# Patient Record
Sex: Male | Born: 1975 | Race: White | Hispanic: No | Marital: Married | Smoking: Never smoker
Health system: Southern US, Community
[De-identification: ages and names within clinical notes are randomized; demographics above are authoritative.]

## PROBLEM LIST (undated history)

## (undated) DIAGNOSIS — F329 Major depressive disorder, single episode, unspecified: Secondary | ICD-10-CM

## (undated) DIAGNOSIS — F419 Anxiety disorder, unspecified: Secondary | ICD-10-CM

## (undated) DIAGNOSIS — T7840XA Allergy, unspecified, initial encounter: Secondary | ICD-10-CM

## (undated) DIAGNOSIS — F32A Depression, unspecified: Secondary | ICD-10-CM

## (undated) HISTORY — DX: Major depressive disorder, single episode, unspecified: F32.9

## (undated) HISTORY — PX: HERNIA REPAIR: SHX51

## (undated) HISTORY — DX: Allergy, unspecified, initial encounter: T78.40XA

## (undated) HISTORY — DX: Anxiety disorder, unspecified: F41.9

## (undated) HISTORY — DX: Depression, unspecified: F32.A

---

## 2015-01-16 ENCOUNTER — Encounter (HOSPITAL_BASED_OUTPATIENT_CLINIC_OR_DEPARTMENT_OTHER): Payer: Self-pay

## 2015-01-16 ENCOUNTER — Encounter: Payer: Self-pay | Admitting: Medical

## 2015-01-16 ENCOUNTER — Ambulatory Visit (INDEPENDENT_AMBULATORY_CARE_PROVIDER_SITE_OTHER): Payer: BLUE CROSS/BLUE SHIELD | Admitting: Medical

## 2015-01-16 ENCOUNTER — Ambulatory Visit (HOSPITAL_BASED_OUTPATIENT_CLINIC_OR_DEPARTMENT_OTHER)
Admission: RE | Admit: 2015-01-16 | Discharge: 2015-01-16 | Disposition: A | Discharge status: Home / Self Care | Payer: BLUE CROSS/BLUE SHIELD | Source: Ambulatory Visit | Attending: Medical | Admitting: Medical

## 2015-01-16 ENCOUNTER — Telehealth: Payer: Self-pay | Admitting: Medical

## 2015-01-16 VITALS — BP 106/78 | HR 59 | Temp 97.6°F | Ht 74.0 in | Wt 215.5 lb

## 2015-01-16 DIAGNOSIS — R1030 Lower abdominal pain, unspecified: Secondary | ICD-10-CM | POA: Diagnosis not present

## 2015-01-16 DIAGNOSIS — R1031 Right lower quadrant pain: Secondary | ICD-10-CM | POA: Diagnosis not present

## 2015-01-16 DIAGNOSIS — R109 Unspecified abdominal pain: Secondary | ICD-10-CM | POA: Insufficient documentation

## 2015-01-16 DIAGNOSIS — K4090 Unilateral inguinal hernia, without obstruction or gangrene, not specified as recurrent: Secondary | ICD-10-CM | POA: Diagnosis not present

## 2015-01-16 LAB — CBC WITH DIFFERENTIAL/PLATELET
BASOS PCT: 0.4 % (ref 0.0–3.0)
Basophils Absolute: 0 10*3/uL (ref 0.0–0.1)
EOS PCT: 1.1 % (ref 0.0–5.0)
Eosinophils Absolute: 0.1 10*3/uL (ref 0.0–0.7)
HEMATOCRIT: 48 % (ref 39.0–52.0)
HEMOGLOBIN: 16.1 g/dL (ref 13.0–17.0)
LYMPHS PCT: 22.6 % (ref 12.0–46.0)
Lymphs Abs: 1.7 10*3/uL (ref 0.7–4.0)
MCHC: 33.5 g/dL (ref 30.0–36.0)
MCV: 91.4 fl (ref 78.0–100.0)
Monocytes Absolute: 0.5 10*3/uL (ref 0.1–1.0)
Monocytes Relative: 6.7 % (ref 3.0–12.0)
NEUTROS ABS: 5.3 10*3/uL (ref 1.4–7.7)
Neutrophils Relative %: 69.2 % (ref 43.0–77.0)
PLATELETS: 218 10*3/uL (ref 150.0–400.0)
RBC: 5.25 Mil/uL (ref 4.22–5.81)
RDW: 13.7 % (ref 11.5–15.5)
WBC: 7.7 10*3/uL (ref 4.0–10.5)

## 2015-01-16 LAB — COMPREHENSIVE METABOLIC PANEL
ALBUMIN: 4.7 g/dL (ref 3.5–5.2)
ALT: 23 U/L (ref 0–53)
AST: 20 U/L (ref 0–37)
Alkaline Phosphatase: 52 U/L (ref 39–117)
BUN: 11 mg/dL (ref 6–23)
CALCIUM: 9.7 mg/dL (ref 8.4–10.5)
CHLORIDE: 102 meq/L (ref 96–112)
CO2: 29 meq/L (ref 19–32)
CREATININE: 0.96 mg/dL (ref 0.40–1.50)
GFR: 91.97 mL/min (ref >60.00)
Glucose, Bld: 96 mg/dL (ref 70–99)
POTASSIUM: 4.2 meq/L (ref 3.5–5.1)
Sodium: 138 mEq/L (ref 135–145)
Total Bilirubin: 1 mg/dL (ref 0.2–1.2)
Total Protein: 7.4 g/dL (ref 6.0–8.3)

## 2015-01-16 MED ORDER — IOHEXOL 300 MG/ML  SOLN
100.0000 mL | Freq: Once | INTRAMUSCULAR | Status: AC | PRN
  Administered 2015-01-16: 100 mL via INTRAVENOUS

## 2015-01-16 MED ORDER — DICLOFENAC SODIUM 75 MG PO TBEC: 75.0000 mg | DELAYED_RELEASE_TABLET | Freq: Two times a day (BID) | ORAL | Status: DC

## 2015-01-16 NOTE — Patient Instructions (Addendum)
Will try to refer to surgeon to evaluate if you have early rt inguinal hernia.  Cbc stat today and cmp. Diclofenac rx today.  Since I was told stat referral to surgeon impossible I do want to do Ct abd/pelvis to evaluate our appendix.  Follow up in 7 days or as needed.  Also even if test negative but if your pain were to worsen then ED evaluation. Sometimes early initial testing negative then later turn positive.

## 2015-01-16 NOTE — Telephone Encounter (Signed)
Informed of ct findings/hernia found. Will refer to surgeon for hernia. Will you try to get him in next week.

## 2015-01-16 NOTE — Progress Notes (Signed)
Subjective:    Patient ID: Corey Howe, male    DOB: 07-Feb-1976, 39 y.o.   MRN: [id number]  HPI   I have reviewed pt PMH, PSH, FH, Social History and Surgical History  Mild seasonal allergies in the fall. Does not have to take meds.  History of both depression and anxiety in the past. Was on some med 2-3 years ago. Was on xanax and ssri in past. Did not like side effects. But good mood now.  Pt works at [employer], no exercise, 2-3 cups coffee a day, he admits to eating fried foods, unhealthy, 2 beers a day up until recently, married- wife pregant.  Pt in states his lower abdomen hurts. Pain for about 10 days. Pain is constant. Pt states 2 wks ago was lifting some furniture(then next day or so noted some discomfort). He has a lot of gas and pressure. Pt has appetite. He not sure if eating makes it worse. No fever, no chills, no nausea, and no vomiting. Pt had bowel movement this am.  Pain will come and go.     Review of Systems  Constitutional: Negative for fever, chills and fatigue.  HENT: Negative for congestion, drooling and ear pain.   Respiratory: Negative for cough, chest tightness, shortness of breath and wheezing.   Cardiovascular: Negative for chest pain and palpitations.  Gastrointestinal: Positive for abdominal pain.  Genitourinary: Negative for dysuria, frequency and penile pain.  Musculoskeletal: Negative for back pain.  Neurological: Negative for dizziness and headaches.  Hematological: Negative for adenopathy. Does not bruise/bleed easily.    Past Medical History  Diagnosis Date  . Allergy   . Anxiety   . Depression     Social History   Social History  . Marital Status: Married    Spouse Name: N/A  . Number of Children: N/A  . Years of Education: N/A   Occupational History  . Not on file.   Social History Main Topics  . Smoking status: Never Smoker   . Smokeless tobacco: Never Used  . Alcohol Use: 1.2 oz/week    0 Standard  drinks or equivalent, 2 Cans of beer per week     Comment: 2 beers a day but reduced recently.  . Drug Use: No  . Sexual Activity: Not on file   Other Topics Concern  . Not on file   Social History Narrative  . No narrative on file    History reviewed. No pertinent past surgical history.  Family History  Problem Relation Age of Onset  . Gallbladder disease Mother     No Known Allergies  No current outpatient prescriptions on file prior to visit.   No current facility-administered medications on file prior to visit.    BP 106/78 mmHg  Pulse 59  Temp(Src) 97.6 F (36.4 C) (Oral)  Ht  (1.88 m)  Wt 215 lb 8 oz (97.75 kg)  BMI 27.66 kg/m2  SpO2 98%       Objective:   Physical Exam General Appearance- Not in acute distress.  HEENT Eyes- Scleraeral/Conjuntiva-bilat- Not Yellow. Mouth & Throat- Normal.  Chest and Lung Exam Auscultation: Breath sounds:-Normal. Adventitious sounds:- No Adventitious sounds.  Cardiovascular Auscultation:Rythm - Regular. Heart Sounds -Normal heart sounds.  Abdomen Inspection:-Inspection Normal.  Palpation/Perucssion: Palpation and Percussion of the abdomen reveal- very faint rlq  Tender, No Rebound tenderness, No rigidity(Guarding) and No Palpable abdominal masses.  Liver:-Normal.  Spleen:- Normal.   No heal jar pain.  Genital- normal penis. Descended testicles.  Rt inguinal canal at upper portion on coughin he does have what appears to be hernia the bulges quickly and reduces quickly. Pain localizes directly/mostly in this area. Lt inguinal canal- clear.  Back- no cva tenderness      Assessment & Plan:  Will try to refer to surgeon to evaluate if you have early rt inguinal hernia.  Cbc stat today and cmp. Diclofenac rx today.  Since I was told stat referral to surgeon impossible I do want to do Ct abd/pelvis to evaluate our appendix.  Follow up in 7 days or as needed.  Also even if test negative but if your pain  were to worsen then ED evaluation. Sometimes early initial testing negative then later turn

## 2015-01-17 NOTE — Telephone Encounter (Signed)
Referral faxed to [facility]/awaiting appt

## 2015-01-28 ENCOUNTER — Emergency Department (HOSPITAL_BASED_OUTPATIENT_CLINIC_OR_DEPARTMENT_OTHER)
Admission: EM | Admit: 2015-01-28 | Discharge: 2015-01-29 | Disposition: A | Discharge status: Home / Self Care | Payer: BLUE CROSS/BLUE SHIELD | Attending: Emergency Medicine | Admitting: Emergency Medicine

## 2015-01-28 ENCOUNTER — Encounter (HOSPITAL_BASED_OUTPATIENT_CLINIC_OR_DEPARTMENT_OTHER): Payer: Self-pay | Admitting: Emergency Medicine

## 2015-01-28 DIAGNOSIS — Y92003 Bedroom of unspecified non-institutional (private) residence as the place of occurrence of the external cause: Secondary | ICD-10-CM | POA: Diagnosis not present

## 2015-01-28 DIAGNOSIS — Z23 Encounter for immunization: Secondary | ICD-10-CM | POA: Insufficient documentation

## 2015-01-28 DIAGNOSIS — Z791 Long term (current) use of non-steroidal anti-inflammatories (NSAID): Secondary | ICD-10-CM | POA: Insufficient documentation

## 2015-01-28 DIAGNOSIS — Y998 Other external cause status: Secondary | ICD-10-CM | POA: Diagnosis not present

## 2015-01-28 DIAGNOSIS — S01311A Laceration without foreign body of right ear, initial encounter: Secondary | ICD-10-CM | POA: Diagnosis not present

## 2015-01-28 DIAGNOSIS — Z8659 Personal history of other mental and behavioral disorders: Secondary | ICD-10-CM | POA: Diagnosis not present

## 2015-01-28 DIAGNOSIS — Y9389 Activity, other specified: Secondary | ICD-10-CM | POA: Diagnosis not present

## 2015-01-28 DIAGNOSIS — W228XXA Striking against or struck by other objects, initial encounter: Secondary | ICD-10-CM | POA: Insufficient documentation

## 2015-01-28 MED ORDER — TETANUS-DIPHTH-ACELL PERTUSSIS 5-2.5-18.5 LF-MCG/0.5 IM SUSP
0.5000 mL | Freq: Once | INTRAMUSCULAR | Status: AC
  Administered 2015-01-29: 0.5 mL via INTRAMUSCULAR
  Filled 2015-01-28: qty 0.5

## 2015-01-28 MED ORDER — LIDOCAINE HCL (PF) 1 % IJ SOLN
5.0000 mL | Freq: Once | INTRAMUSCULAR | Status: AC
  Administered 2015-01-29: 5 mL via INTRADERMAL
  Filled 2015-01-28: qty 5

## 2015-01-28 MED ORDER — CEFAZOLIN SODIUM 1-5 GM-% IV SOLN
1.0000 g | Freq: Once | INTRAVENOUS | Status: AC
  Administered 2015-01-29: 1 g via INTRAVENOUS
  Filled 2015-01-28: qty 50

## 2015-01-28 NOTE — ED Notes (Signed)
Pt in with severe lac to R ear, after falling and hitting it on a bedside table. Pt was in hot tub and started feeling dizzy and went to get in the bed and fell. States same has happened before.

## 2015-01-28 NOTE — ED Provider Notes (Signed)
CSN: [id number]     Arrival date & time 01/28/15  2218 History  By signing my name below, I, Corey Howe, attest that this documentation has been prepared under the direction and in the presence of Paula Libra, MD. Electronically Signed: Bethel Howe, ED Scribe. 01/28/2015. 11:42 PM   Chief Complaint  Patient presents with  . Laceration   The history is provided by the patient. No language interpreter was used.   Corey Howe is a 39 y.o. male who presents to the Emergency Department complaining of a fall this evening at home. The pt was lightheaded after standing from a hot bath. He walked to his bedroom before falling and striking his head on a bedside table. There was no loss of consciousness. He fell against a nightstand lacerating his right ear. There is a full-thickness laceration to the right ear. There was copious bleeding at home which has improved with pressure. There is mild to moderate pain associated with the injury, worse with palpation. Pt denies neck pain. It is not uncommon for him to get lightheaded after a hot bath or shower.  Past Medical History  Diagnosis Date  . Allergy   . Anxiety   . Depression    History reviewed. No pertinent past surgical history. Family History  Problem Relation Age of Onset  . Gallbladder disease Mother    Social History  Substance Use Topics  . Smoking status: Never Smoker   . Smokeless tobacco: Never Used  . Alcohol Use: 1.2 oz/week    0 Standard drinks or equivalent, 2 Cans of beer per week     Comment: 2 beers a day but reduced recently.    Review of Systems 10 Systems reviewed and all are negative for acute change except as noted in the HPI.  Allergies  Review of patient's allergies indicates no known allergies.  Home Medications   Prior to Admission medications   Medication Sig Start Date End Date Taking? Authorizing Provider  diclofenac (VOLTAREN) 75 MG EC tablet Take 1 tablet (75 mg total) by mouth 2 (two) times  daily. 01/16/15   Edward Saguier, PA-C   BP 126/84 mmHg  Pulse 78  Temp(Src) 98.6 F (37 C) (Oral)  Resp 18  Ht  (1.854 m)  Wt 210 lb (95.255 kg)  BMI 27.71 kg/m2  SpO2 100%  Physical Exam General: Well-developed, well-nourished male in no acute distress; appearance consistent with age of record HENT: normocephalic; full thickness laceration of the right external ear:   Eyes: pupils equal, round and reactive to light; extraocular muscles intact Neck: supple; no C-spine tenderness Heart: regular rate and rhythm; no murmurs, rubs or gallops Lungs: clear to auscultation bilaterally Abdomen: soft; nondistended; nontender; bowel sounds present Extremities: No deformity; full range of motion; pulses normal Neurologic: Awake, alert and oriented; motor function intact in all extremities and symmetric; no facial droop Skin: Warm and dry Psychiatric: Flat affect  ED Course  Procedures (including critical care time) DIAGNOSTIC STUDIES: Oxygen Saturation is 100% on RA,  normal by my interpretation.    COORDINATION OF CARE: 11:38 PM Discussed treatment plan which includes abx and maxillofacial surgery consult with pt at bedside and pt agreed to plan.  LACERATION REPAIR Performed by: Hanley Seamen Authorized by: Hanley Seamen Consent: Verbal consent obtained. Risks and benefits: risks, benefits and alternatives were discussed Consent given by: patient Patient identity confirmed: provided demographic data Prepped and Draped in normal sterile fashion Wound explored  Laceration Location: Anterior right ear  Laceration  Length: 4 cm  No Foreign Bodies seen or palpated  Anesthesia: local infiltration  Local anesthetic: lidocaine 2 % without epinephrine  Anesthetic total: 5 ml  Irrigation method: syringe Amount of cleaning: standard  Skin closure: 5-0 Prolene   Number of sutures: 7   Technique: Simple interrupted   Patient tolerance: Patient tolerated the procedure  well with no immediate complications.   LACERATION REPAIR Performed by: [name],[name] Authorized by: [name],[name] Consent: Verbal consent obtained. Risks and benefits: risks, benefits and alternatives were discussed Consent given by: patient Patient identity confirmed: provided demographic data Prepped and Draped in normal sterile fashion Wound explored  Laceration Location: Posterior right ear  Laceration Length: 5 cm  No Foreign Bodies seen or palpated  Anesthesia: local infiltration  Local anesthetic: lidocaine 2 % without epinephrine  Anesthetic total: 4 ml  Irrigation method: syringe Amount of cleaning: standard  Skin closure: 4-0 Prolene   Number of sutures: 5   Technique: Simple interrupted   Patient tolerance: Patient tolerated the procedure well with no immediate complications.      MDM  Dr. Jeanice Limurham will see the patient later this morning in his office. Given Ancef 1 gram in the ED and his tetanus was updated.  Final diagnoses:  Laceration of right ear, initial encounter   I personally performed the services described in this documentation, which was scribed in my presence. The recorded information has been reviewed and is accurate.    [name], MD 01/29/15 0040

## 2015-01-28 NOTE — ED Notes (Signed)
Fell after getting out of hot tube hit rt ear on dresser  Lac to rt ear

## 2015-01-28 NOTE — ED Notes (Signed)
Took 2 500mg  tylenol prior to arrival.

## 2015-01-28 NOTE — ED Notes (Signed)
Pt taking a shower got dizzy and fell hitting right ear on corner of dresser. Ear is severed with bleeding controlled.

## 2015-01-29 ENCOUNTER — Encounter (HOSPITAL_BASED_OUTPATIENT_CLINIC_OR_DEPARTMENT_OTHER): Payer: Self-pay | Admitting: Emergency Medicine

## 2015-01-29 MED ORDER — HYDROCODONE-ACETAMINOPHEN 5-325 MG PO TABS: 1.0000 | ORAL_TABLET | Freq: Four times a day (QID) | ORAL | Status: DC | PRN

## 2015-01-29 MED ORDER — LIDOCAINE HCL (PF) 1 % IJ SOLN
INTRAMUSCULAR | Status: AC
  Filled 2015-01-29: qty 5

## 2015-01-29 MED ORDER — LIDOCAINE HCL (PF) 1 % IJ SOLN
5.0000 mL | Freq: Once | INTRAMUSCULAR | Status: AC
  Administered 2015-01-29: 5 mL via INTRADERMAL

## 2015-01-29 MED ORDER — CEPHALEXIN 500 MG PO CAPS: 500.0000 mg | ORAL_CAPSULE | Freq: Four times a day (QID) | ORAL | Status: DC

## 2015-01-29 NOTE — Discharge Instructions (Signed)
Facial Laceration ° A facial laceration is a cut on the face. These injuries can be painful and cause bleeding. Lacerations usually heal quickly, but they need special care to reduce scarring. °DIAGNOSIS  °Your health care provider will take a medical history, ask for details about how the injury occurred, and examine the wound to determine how deep the cut is. °TREATMENT  °Some facial lacerations may not require closure. Others may not be able to be closed because of an increased risk of infection. The risk of infection and the chance for successful closure will depend on various factors, including the amount of time since the injury occurred. °The wound may be cleaned to help prevent infection. If closure is appropriate, pain medicines may be given if needed. Your health care provider will use stitches (sutures), wound glue (adhesive), or skin adhesive strips to repair the laceration. These tools bring the skin edges together to allow for faster healing and a better cosmetic outcome. If needed, you may also be given a tetanus shot. °HOME CARE INSTRUCTIONS °· Only take over-the-counter or prescription medicines as directed by your health care provider. °· Follow your health care provider's instructions for wound care. These instructions will vary depending on the technique used for closing the wound. °For Sutures: °· Keep the wound clean and dry.   °· If you were given a bandage (dressing), you should change it at least once a day. Also change the dressing if it becomes wet or dirty, or as directed by your health care provider.   °· Wash the wound with soap and water 2 times a day. Rinse the wound off with water to remove all soap. Pat the wound dry with a clean towel.   °· After cleaning, apply a thin layer of the antibiotic ointment recommended by your health care provider. This will help prevent infection and keep the dressing from sticking.   °· You may shower as usual after the first 24 hours. Do not soak the  wound in water until the sutures are removed.   °· Get your sutures removed as directed by your health care provider. With facial lacerations, sutures should usually be taken out after 4-5 days to avoid stitch marks.   °· Wait a few days after your sutures are removed before applying any makeup. °For Skin Adhesive Strips: °· Keep the wound clean and dry.   °· Do not get the skin adhesive strips wet. You may bathe carefully, using caution to keep the wound dry.   °· If the wound gets wet, pat it dry with a clean towel.   °· Skin adhesive strips will fall off on their own. You may trim the strips as the wound heals. Do not remove skin adhesive strips that are still stuck to the wound. They will fall off in time.   °For Wound Adhesive: °· You may briefly wet your wound in the shower or bath. Do not soak or scrub the wound. Do not swim. Avoid periods of heavy sweating until the skin adhesive has fallen off on its own. After showering or bathing, gently pat the wound dry with a clean towel.   °· Do not apply liquid medicine, cream medicine, ointment medicine, or makeup to your wound while the skin adhesive is in place. This may loosen the film before your wound is healed.   °· If a dressing is placed over the wound, be careful not to apply tape directly over the skin adhesive. This may cause the adhesive to be pulled off before the wound is healed.   °· Avoid   prolonged exposure to sunlight or tanning lamps while the skin adhesive is in place. °· The skin adhesive will usually remain in place for 5-10 days, then naturally fall off the skin. Do not pick at the adhesive film.   °After Healing: °Once the wound has healed, cover the wound with sunscreen during the day for 1 full year. This can help minimize scarring. Exposure to ultraviolet light in the first year will darken the scar. It can take 1-2 years for the scar to lose its redness and to heal completely.  °SEEK MEDICAL CARE IF: °· You have a fever. °SEEK IMMEDIATE  MEDICAL CARE IF: °· You have redness, pain, or swelling around the wound.   °· You see a yellowish-white fluid (pus) coming from the wound.   °  °This information is not intended to replace advice given to you by your health care provider. Make sure you discuss any questions you have with your health care provider. °  °Document Released: 03/04/2004 Document Revised: 02/15/2014 Document Reviewed: 09/07/2012 °Elsevier Interactive Patient Education ©2016 [employer] ° °

## 2015-01-30 ENCOUNTER — Telehealth: Payer: Self-pay | Admitting: Medical

## 2015-01-30 NOTE — Telephone Encounter (Signed)
Patient Name: Corey Howe DOB: 07/06/1975 Initial Comment caller states husband cut his ear and has stitches, has wound care questions Nurse Assessment Nurse: Yetta BarreJones, RN, Miranda Date/Time (Eastern Time): 01/30/2015 11:15:58 AM Confirm and document reason for call. If symptomatic, describe symptoms. ---Caller states her husband has sutures in his ear from an injury on Tuesday. The site looks good but she has questions about wound care. It was recommended he follow up with plastic surgery but has not because they cannot afford it. Has the patient traveled out of the country within the last 30 days? ---Not Applicable Does the patient have any new or worsening symptoms? ---Yes Will a triage be completed? ---Yes Related visit to physician within the last 2 weeks? ---Yes Does the PT have any chronic conditions? (i.e. diabetes, asthma, etc.) ---Unknown Is this a behavioral health or substance abuse call? ---No Guidelines Guideline Title Affirmed Question Affirmed Notes Suture or Staple Questions Care of sutured wound, questions about Final Disposition User Home Care Yetta BarreJones, RN, Miranda Disagree/Comply: Danella Maiersomply

## 2015-02-05 ENCOUNTER — Encounter: Payer: Self-pay | Admitting: Medical

## 2015-02-05 ENCOUNTER — Ambulatory Visit (INDEPENDENT_AMBULATORY_CARE_PROVIDER_SITE_OTHER): Payer: BLUE CROSS/BLUE SHIELD | Admitting: Medical

## 2015-02-05 VITALS — BP 118/78 | HR 58 | Temp 98.0°F | Ht 74.0 in | Wt 212.0 lb

## 2015-02-05 DIAGNOSIS — Z4802 Encounter for removal of sutures: Secondary | ICD-10-CM

## 2015-02-05 DIAGNOSIS — R55 Syncope and collapse: Secondary | ICD-10-CM | POA: Diagnosis not present

## 2015-02-05 NOTE — Progress Notes (Signed)
Subjective:    Patient ID: Corey Howe, male    DOB: 12/03/1975, 39 y.o.   MRN: [id number]  HPI    Pt in for follow the ED. He had sutures placed. He had just been in a very hot tub night of injury. Also had been drinking rum and coke(he stood up and walked passed out and hit night stand. Suffered laceration to his ear.  He state 2 other events in past 6 months passing out episodes coming out of shower .Another time he was constipated and straining. Then passed out.  Pt rt ear feeling itching presently. Comes and goes. They told to remove   Pt states his prior abdomen appears to have been related to constipation. He states since eating high fiber diet every day has resolved his symptoms.    Review of Systems  Constitutional: Negative for fever, chills and fatigue.  Respiratory: Negative for cough, choking, chest tightness and wheezing.   Cardiovascular: Negative for chest pain and palpitations.  Gastrointestinal: Negative for abdominal pain.  Musculoskeletal: Negative for back pain.  Neurological: Negative for dizziness, seizures, weakness, numbness and headaches.       None presenlty.  Hematological: Negative for adenopathy. Does not bruise/bleed easily.  Psychiatric/Behavioral: Negative for behavioral problems.    Past Medical History  Diagnosis Date  . Allergy   . Anxiety   . Depression     Social History   Social History  . Marital Status: Married    Spouse Name: N/A  . Number of Children: N/A  . Years of Education: N/A   Occupational History  . Not on file.   Social History Main Topics  . Smoking status: Never Smoker   . Smokeless tobacco: Never Used  . Alcohol Use: 1.2 oz/week    2 Cans of beer, 0 Standard drinks or equivalent per week     Comment: 2 beers a day but reduced recently.  . Drug Use: No  . Sexual Activity: Not on file   Other Topics Concern  . Not on file   Social History Narrative    No past surgical history on file.  Family History    Problem Relation Age of Onset  . Gallbladder disease Mother     No Known Allergies  Current Outpatient Prescriptions on File Prior to Visit  Medication Sig Dispense Refill  . cephALEXin (KEFLEX) 500 MG capsule Take 1 capsule (500 mg total) by mouth 4 (four) times daily. 20 capsule 0  . diclofenac (VOLTAREN) 75 MG EC tablet Take 1 tablet (75 mg total) by mouth 2 (two) times daily. 30 tablet 0  . HYDROcodone-acetaminophen (NORCO) 5-325 MG tablet Take 1-2 tablets by mouth every 6 (six) hours as needed (for pain). 10 tablet 0   No current facility-administered medications on file prior to visit.    BP 118/78 mmHg  Pulse 58  Temp(Src) 98 F (36.7 C) (Oral)  Ht 6\' 2"  (1.88 m)  Wt 212 lb (96.163 kg)  BMI 27.21 kg/m2  SpO2 98%       Objective:   Physical Exam  General Mental Status- Alert. General Appearance- Not in acute distress.   Skin General: Color- Normal Color. Moisture- Normal Moisture.  Neck Carotid Arteries- Normal color. Moisture- Normal Moisture. No carotid bruits. No JVD.  Chest and Lung Exam Auscultation: Breath Sounds:-Normal.  Cardiovascular Auscultation:Rythm- Regular. Murmurs & Other Heart Sounds:Auscultation of the heart reveals- No Murmurs.  Abdomen Inspection:-Inspeection Normal. Palpation/Percussion:Note:No mass. Palpation and Percussion of the abdomen reveal- Non Tender,  Non Distended + BS, no rebound or guarding.    Neurologic Cranial Nerve exam:- CN III-XII intact(No nystagmus), symmetric smile. Strength:- 5/5 equal and symmetric strength both upper and lower extremities.  Rt ear- 12 sutures placed. For full description see pictures from ED visit. The ear looks moderate well healed(took out 8 sutures that look well healed where  parts of suture knots buried.(thought best to remove those) 4 sutures remaining and will remove on Friday.     Assessment & Plan:  I removed 8 sutures today. The remaining 4 sutures want to remove on  Friday.  With you repetitive syncope episodes and your upcoming surgery which will require general anesthesia, we did ekg today and I do want to get surgeon opinion regarding your passing out episodes. If they think only vasovagal type.

## 2015-02-05 NOTE — Progress Notes (Signed)
Pre visit review using our clinic review tool, if applicable. No additional management support is needed unless otherwise documented below in the visit note. 

## 2015-02-05 NOTE — Patient Instructions (Signed)
I removed 8 sutures today. The remaining 4 sutures want to remove on Friday.  With you repetitive syncope episodes and your upcoming surgery which will require general anesthesia, we did ekg today and I do want to get surgeon opinion regarding your passing out episodes. If they think only vasovagal type.

## 2015-02-07 ENCOUNTER — Encounter: Payer: Self-pay | Admitting: Medical

## 2015-02-07 ENCOUNTER — Ambulatory Visit (INDEPENDENT_AMBULATORY_CARE_PROVIDER_SITE_OTHER): Payer: BLUE CROSS/BLUE SHIELD | Admitting: Medical

## 2015-02-07 VITALS — BP 110/70 | HR 73 | Temp 98.1°F | Ht 74.0 in | Wt 212.8 lb

## 2015-02-07 DIAGNOSIS — Z4802 Encounter for removal of sutures: Secondary | ICD-10-CM

## 2015-02-07 NOTE — Patient Instructions (Signed)
Removed sutures with no complications. Recommend pat wash and dry area for next week. No aggressive scrubbing. If any complications pain etc. Let us know.  Follow up as needed.

## 2015-02-07 NOTE — Progress Notes (Signed)
Pre visit review using our clinic review tool, if applicable. No additional management support is needed unless otherwise documented below in the visit note. 

## 2015-02-07 NOTE — Progress Notes (Signed)
   Subjective:    Patient ID: Corey CatenaCollin No, male    DOB: 12/15/1975, 39 y.o.   MRN: [id number]  HPI   Here to remove the remaining sutures from rt ear. Prior visit removed other sutures. See last note.   Review of Systems  Constitutional: Negative for fever, chills and fatigue.  HENT: Negative for ear pain.        Ear feels good. No pain       Objective:   Physical Exam  Rt ear- no redness, no tenderness, no swelling. Edges of laceration look like healing well.(Removed remaining sutures with no problem)     Assessment & Plan:  Removed sutures with no complications. Recommend pat wash and dry area for next week. No aggressive scrubbing. If any complications pain etc. Let us know.  Follow up as needed.

## 2015-02-27 ENCOUNTER — Telehealth: Payer: Self-pay | Admitting: Medical

## 2015-02-27 NOTE — Telephone Encounter (Signed)
Pt needing work note to be out from 01/16/15 when he saw [place] for dx of hernia until surgery which was done on 02/14/15. Surgeon will write him out after surgery. Discussed with Francesco Runner. She will try to get done while pt wife is in office with newborn baby. Otherwise please call him to come pick up.

## 2015-02-27 NOTE — Telephone Encounter (Signed)
Work note printed and sent with patients wife per VO from patient for wife to pickup.

## 2016-05-04 ENCOUNTER — Ambulatory Visit: Payer: BLUE CROSS/BLUE SHIELD | Admitting: Medical

## 2016-05-04 ENCOUNTER — Telehealth: Payer: Self-pay | Admitting: Medical

## 2016-05-04 NOTE — Telephone Encounter (Signed)
Patient cancelled 11:30am appointment for today due to personal reasons, charge or no charge

## 2016-05-04 NOTE — Telephone Encounter (Signed)
charge 

## 2016-05-11 ENCOUNTER — Encounter: Payer: Self-pay | Admitting: Medical

## 2017-03-25 IMAGING — CT CT ABD-PELV W/ CM
2 of 5 series · 17 of 46 positions shown, 19 images · IV contrast (APPLIED)
Comparison: None available

CLINICAL DATA: RIGHT lower quadrant pain for 10 days after moving
heavy furniture.

EXAM:
CT ABDOMEN AND PELVIS WITH CONTRAST
TECHNIQUE: Multidetector CT imaging of the abdomen and pelvis was performed
using the standard protocol following bolus administration of
intravenous contrast.
CONTRAST:  100mL OMNIPAQUE IOHEXOL 300 MG/ML  SOLN

[Series 2: abd/pelvis 5.0 b31f · axial · 0.74mm/px · z∈[-588,-163]mm · 14 of 97 slices shown, 16 images]
[im 6/97  soft-tissue]
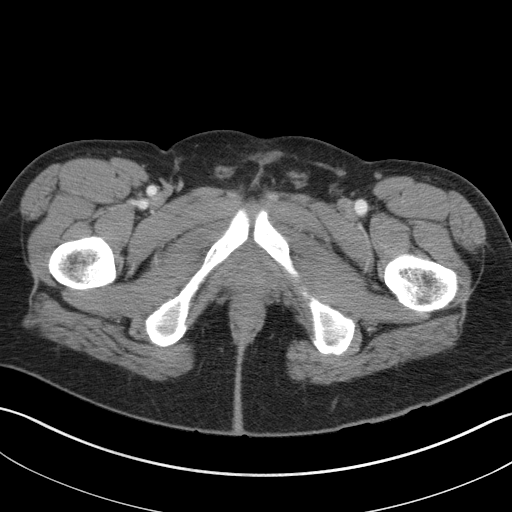
[im 6/97  bone]
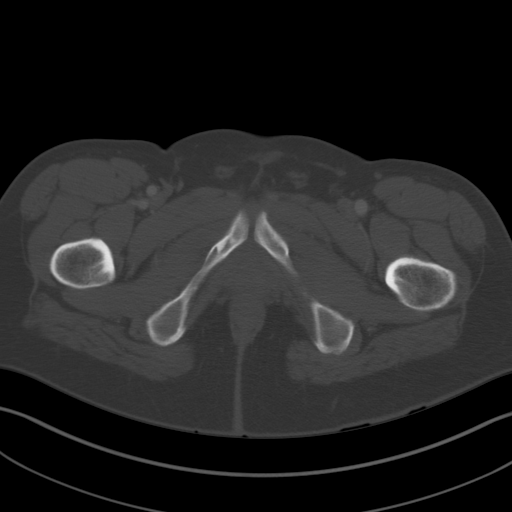
[im 11/97  soft-tissue]
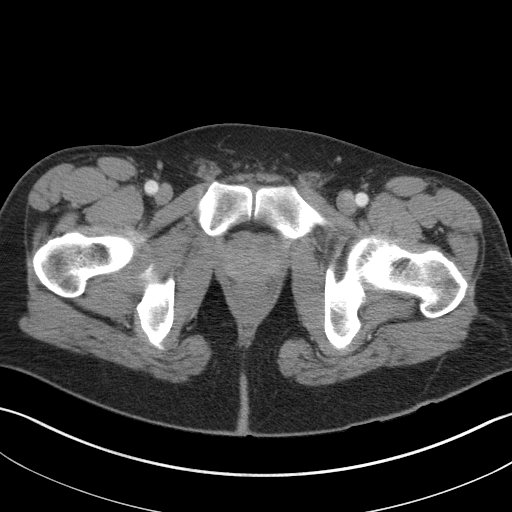
[im 22/97  soft-tissue]
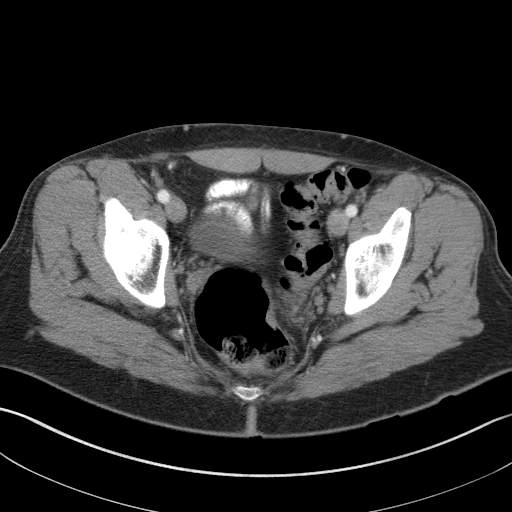
[im 27/97  soft-tissue]
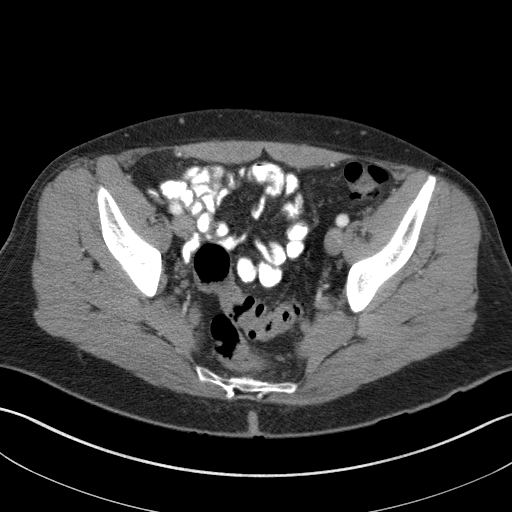
[im 33/97  soft-tissue]
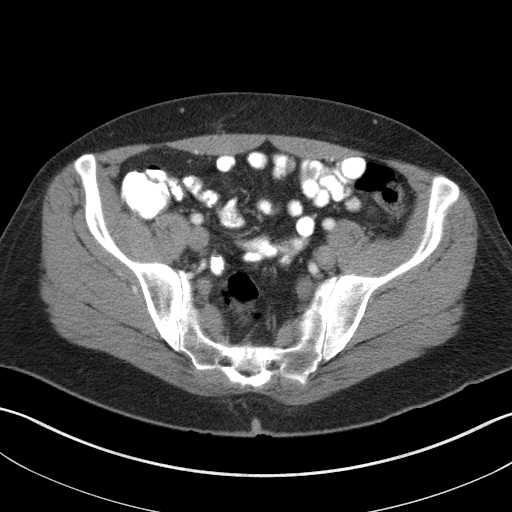
[im 38/97  soft-tissue]
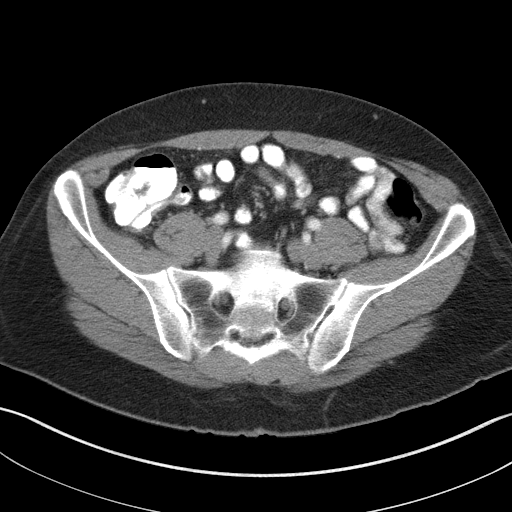
[im 43/97  soft-tissue]
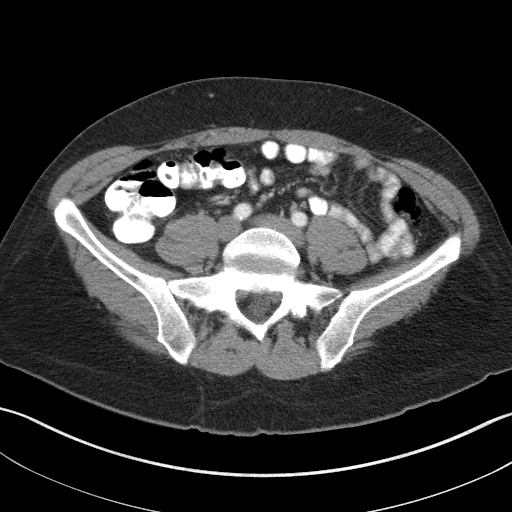
[im 54/97  soft-tissue]
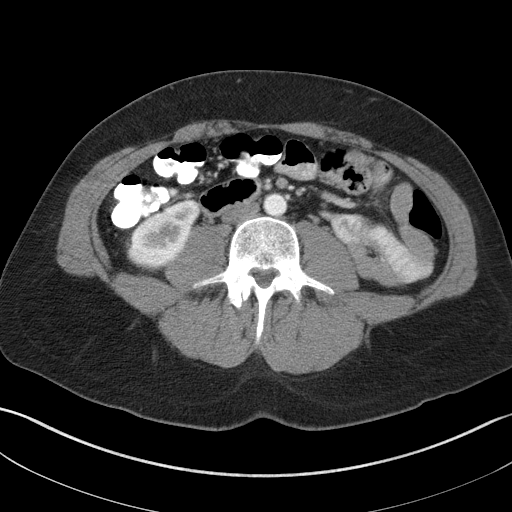
[im 59/97  soft-tissue]
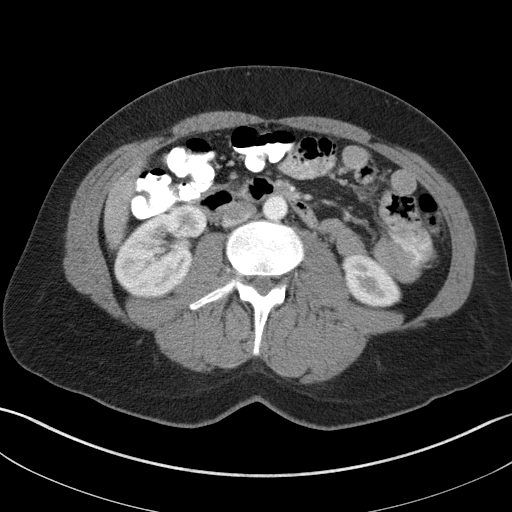
[im 59/97  bone]
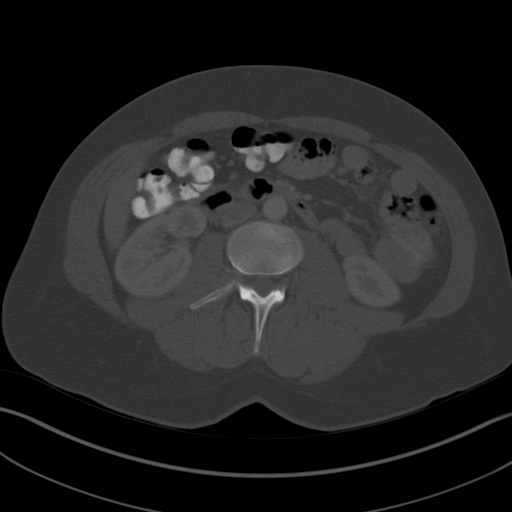
[im 65/97  soft-tissue]
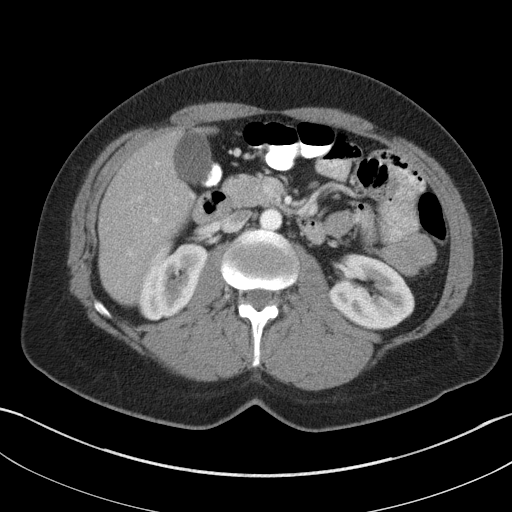
[im 70/97  soft-tissue]
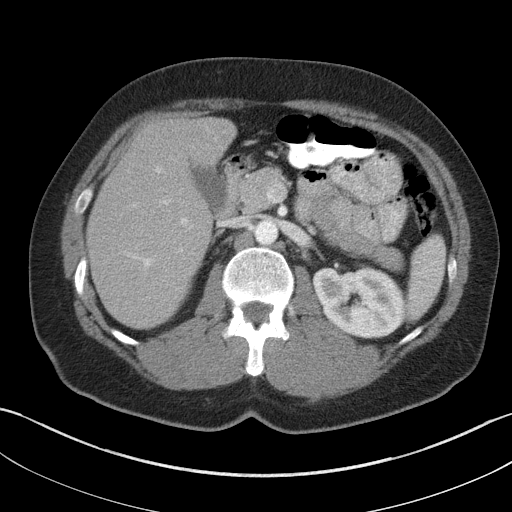
[im 75/97  soft-tissue]
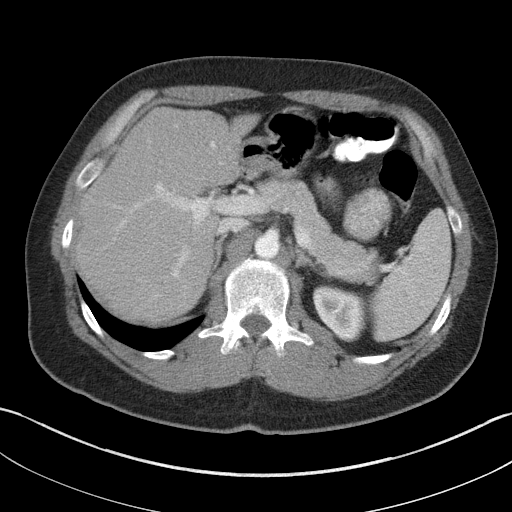
[im 86/97  soft-tissue]
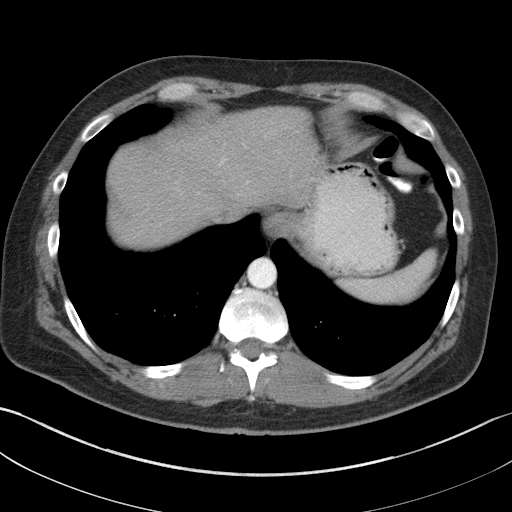
[im 91/97  soft-tissue]
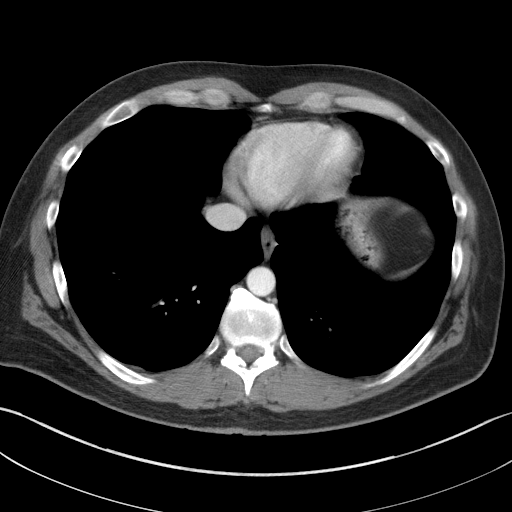

[Series 5: abd/pelvis 3.0 coronal · coronal · 0.81mm/px · 3 of 91 slices shown]
[im 31/91  soft-tissue]
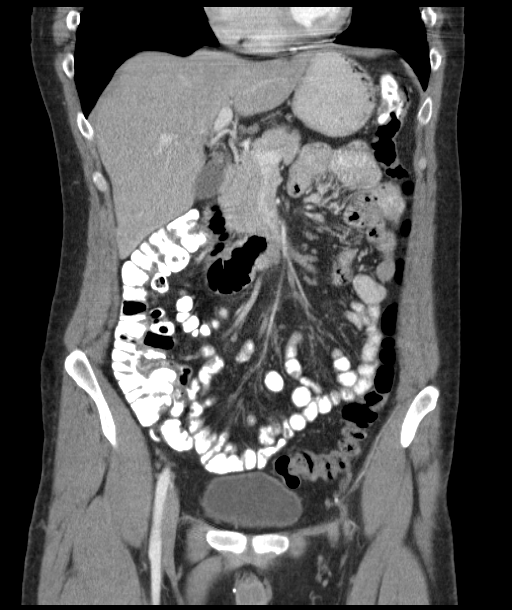
[im 41/91  soft-tissue]
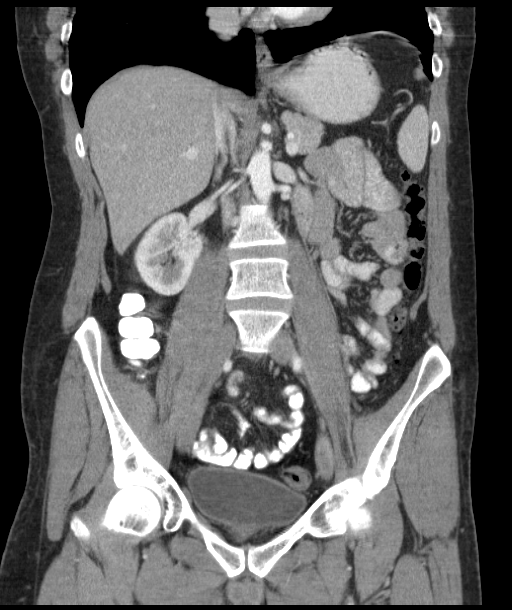
[im 51/91  soft-tissue]
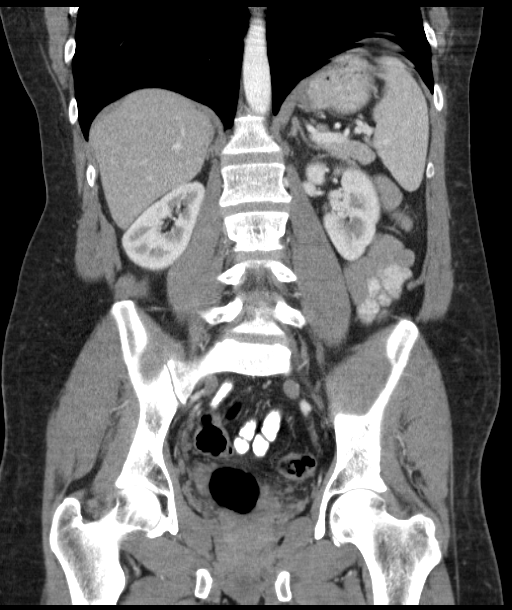

[17 of 46 positions shown; findings below may reference images not displayed]

FINDINGS: Lower chest: Lung bases are clear.

Hepatobiliary: No focal hepatic lesion. No biliary duct dilatation.
Gallbladder is normal. Common bile duct is normal.

Pancreas: Pancreas is normal. No ductal dilatation. No pancreatic
inflammation.

Spleen: Normal spleen

Adrenals/urinary tract: Adrenal glands and kidneys are normal. The
ureters and bladder normal.

Stomach/Bowel: Stomach, small bowel, appendix, and cecum are normal.
The colon and rectosigmoid colon are normal.

Vascular/Lymphatic: Abdominal aorta is normal caliber. There is no
retroperitoneal or periportal lymphadenopathy. No pelvic
lymphadenopathy.

Other: Small fat filled RIGHT inguinal hernia.

Musculoskeletal: No aggressive osseous lesion.
IMPRESSION: 1. Normal appendix.
2. No acute findings in the abdomen or pelvis.
3. Small RIGHT inguinal fat filled hernia.

## 2018-03-16 ENCOUNTER — Ambulatory Visit: Payer: 59 | Admitting: Family Medicine

## 2018-03-16 ENCOUNTER — Encounter: Payer: Self-pay | Admitting: Family Medicine

## 2018-03-16 ENCOUNTER — Encounter: Payer: Self-pay | Admitting: Emergency Medicine

## 2018-03-16 VITALS — BP 122/78 | HR 85 | Temp 98.3°F | Resp 16 | Ht 74.0 in | Wt 234.0 lb

## 2018-03-16 DIAGNOSIS — J0140 Acute pansinusitis, unspecified: Secondary | ICD-10-CM

## 2018-03-16 MED ORDER — AMOXICILLIN-POT CLAVULANATE 875-125 MG PO TABS: 1.0000 | ORAL_TABLET | Freq: Two times a day (BID) | ORAL | 0 refills | Status: DC

## 2018-03-16 NOTE — Patient Instructions (Signed)
It was a pleasure to meet you today.  Please drink plenty of water so that your urine is pale yellow or clear.   Mucinex can be used for symptoms.  Delsym can be used for cough.  The antibiotic has been sent to your pharmacy today.   Also, get plenty of rest, use tylenol or ibuprofen as needed for discomfort and follow up if symptoms do not improve in 3 to 4 days, worsen, or you develop a fever >101.   Sinusitis, Adult Sinusitis is soreness and swelling (inflammation) of your sinuses. Sinuses are hollow spaces in the bones around your face. They are located:  Around your eyes.  In the middle of your forehead.  Behind your nose.  In your cheekbones. Your sinuses and nasal passages are lined with a fluid called mucus. Mucus drains out of your sinuses. Swelling can trap mucus in your sinuses. This lets germs (bacteria, virus, or fungus) grow, which leads to infection. Most of the time, this condition is caused by a virus. What are the causes? This condition is caused by:  Allergies.  Asthma.  Germs.  Things that block your nose or sinuses.  Growths in the nose (nasal polyps).  Chemicals or irritants in the air.  Fungus (rare). What increases the risk? You are more likely to develop this condition if:  You have a weak body defense system (immune system).  You do a lot of swimming or diving.  You use nasal sprays too much.  You smoke. What are the signs or symptoms? The main symptoms of this condition are pain and a feeling of pressure around the sinuses. Other symptoms include:  Stuffy nose (congestion).  Runny nose (drainage).  Swelling and warmth in the sinuses.  Headache.  Toothache.  A cough that may get worse at night.  Mucus that collects in the throat or the back of the nose (postnasal drip).  Being unable to smell and taste.  Being very tired (fatigue).  A fever.  Sore throat.  Bad breath. How is this diagnosed? This condition is  diagnosed based on:  Your symptoms.  Your medical history.  A physical exam.  Tests to find out if your condition is short-term (acute) or long-term (chronic). Your doctor may: ? Check your nose for growths (polyps). ? Check your sinuses using a tool that has a light (endoscope). ? Check for allergies or germs. ? Do imaging tests, such as an MRI or CT scan. How is this treated? Treatment for this condition depends on the cause and whether it is short-term or long-term.  If caused by a virus, your symptoms should go away on their own within 10 days. You may be given medicines to relieve symptoms. They include: ? Medicines that shrink swollen tissue in the nose. ? Medicines that treat allergies (antihistamines). ? A spray that treats swelling of the nostrils. ? Rinses that help get rid of thick mucus in your nose (nasal saline washes).  If caused by bacteria, your doctor may wait to see if you will get better without treatment. You may be given antibiotic medicine if you have: ? A very bad infection. ? A weak body defense system.  If caused by growths in the nose, you may need to have surgery. Follow these instructions at home: Medicines  Take, use, or apply over-the-counter and prescription medicines only as told by your doctor. These may include nasal sprays.  If you were prescribed an antibiotic medicine, take it as told by your doctor.  Do not stop taking the antibiotic even if you start to feel better. Hydrate and humidify   Drink enough water to keep your pee (urine) pale yellow.  Use a cool mist humidifier to keep the humidity level in your home above 50%.  Breathe in steam for 10-15 minutes, 3-4 times a day, or as told by your doctor. You can do this in the bathroom while a hot shower is running.  Try not to spend time in cool or dry air. Rest  Rest as much as you can.  Sleep with your head raised (elevated).  Make sure you get enough sleep each night. General  instructions   Put a warm, moist washcloth on your face 3-4 times a day, or as often as told by your doctor. This will help with discomfort.  Wash your hands often with soap and water. If there is no soap and water, use hand sanitizer.  Do not smoke. Avoid being around people who are smoking (secondhand smoke).  Keep all follow-up visits as told by your doctor. This is important. Contact a doctor if:  You have a fever.  Your symptoms get worse.  Your symptoms do not get better within 10 days. Get help right away if:  You have a very bad headache.  You cannot stop throwing up (vomiting).  You have very bad pain or swelling around your face or eyes.  You have trouble seeing.  You feel confused.  Your neck is stiff.  You have trouble breathing. Summary  Sinusitis is swelling of your sinuses. Sinuses are hollow spaces in the bones around your face.  This condition is caused by tissues in your nose that become inflamed or swollen. This traps germs. These can lead to infection.  If you were prescribed an antibiotic medicine, take it as told by your doctor. Do not stop taking it even if you start to feel better.  Keep all follow-up visits as told by your doctor. This is important. This information is not intended to replace advice given to you by your health care provider. Make sure you discuss any questions you have with your health care provider. Document Released: 07/14/2007 Document Revised: 06/27/2017 Document Reviewed: 06/27/2017 Elsevier Interactive Patient Education  2019 [employer]

## 2018-03-16 NOTE — Progress Notes (Signed)
Patient ID: Corey Howe, male   DOB: [birth year], 43 y.o.   MRN: [id number]  PCP: Esperanza Richters, PA-C  Subjective:  Corey Howe is a 43 y.o. year old very pleasant male patient who presents with symptoms including nasal congestion, cough that is minimally productive of yellow/green sputum Associated sinus pressure present No recent antibiotic use Influenza vaccine is not UTD. He is not a smoker -started: one week ago ago, symptoms are not improving but worsening -previous treatments: Nyquil and Tylenol cold and flu have provided limited benefit. -sick contacts/travel/risks: denies flu exposure. Recent sick contact exposure with 12 year old son who goes to daycare -Hx of: seasonal allergies  ROS-denies fever, SOB, NVD, ear pain, tooth pain  Pertinent Past Medical History- He reports a history of a deviated septum.  Medications- reviewed  No current outpatient medications on file.   No current facility-administered medications for this visit.     Objective: BP 122/78   Pulse 85   Temp 98.3 F (36.8 C) (Oral)   Resp 16   Ht 6\' 2"  (1.88 m)   Wt 234 lb (106.1 kg)   SpO2 98%   BMI 30.04 kg/m  Gen: NAD, resting comfortably HEENT: Turbinates erythematous, TMs normal bilaterally, oropharynx is clear and moist,+ frontal and maxillary sinus tenderness CV: RRR no murmurs rubs or gallops Lungs: CTAB no crackles, wheeze, rhonchi Ext: no edema Skin: warm, dry, no rash Neuro: grossly normal, moves all extremities  Assessment/Plan: 1. Acute pansinusitis, recurrence not specified Exam and history are most consistent with sinusitis. With duration of one week and recent sick contact exposure of child in day care, will treat with Augmentin today. Mucinex and Delsym can also be used. Advised patient on supportive measures:  Get rest, drink plenty of fluids, and use tylenol or ibuprofen as needed for discomfort. Follow up if fever >101, if symptoms worsen or if symptoms are not improved in 3 to  4 days.  Advised patient that he is due for routine care with PCP and he will consider influenza vaccine at that time.   - amoxicillin-clavulanate (AUGMENTIN) 875-125 MG tablet; Take 1 tablet by mouth 2 (two) times daily.  Dispense: 20 tablet; Refill: 0  We discussed that we did not find any infection that had higher probability of being bacterial such as pneumonia or strep throat.   Finally, we reviewed reasons to return to care including if symptoms worsen or persist or new concerns arise- once again particularly shortness of breath or fever.   Inez Catalina, FNP

## 2018-03-22 ENCOUNTER — Telehealth: Payer: Self-pay

## 2018-03-22 NOTE — Telephone Encounter (Signed)
I do not see a message from this patient. Please let me know what his call was concerning. Thank you

## 2018-03-22 NOTE — Telephone Encounter (Signed)
Reason for CRM: Patient calling and is requesting a work note for 03/16/2018 and 03/20/2018. States that he had an appointment with Raynelle Fanning on 03/16/2018 and was up all night on 03/19/2018 and could not go to work on 03/20/2018. Please advise.  Emailed to HR at work, [employer] - [email or web address]  CB#: [phone]

## 2018-03-22 NOTE — Telephone Encounter (Signed)
Tried to reach patient to let him know it would be tomorrow before I was able to address his note as Corey Howe is not in the office today. Unable to leave message because voicemail had not been set up.

## 2018-03-23 NOTE — Telephone Encounter (Signed)
Note complete. Called patient this morning and tried to reach him to obtain fax number as I can not email letter per his request. Unable to leave message as his voicemail has not been set up yet. Created CRM incase he calls back to obtain fax number and  I will send today.

## 2018-03-23 NOTE — Telephone Encounter (Signed)
Okay to provide note. Thank you

## 2018-03-23 NOTE — Telephone Encounter (Signed)
Spoke with wife this morning. She will contact patient and have him call us with info or either come by office to pick up note.

## 2018-09-19 ENCOUNTER — Telehealth: Payer: Self-pay | Admitting: Medical

## 2018-09-19 ENCOUNTER — Other Ambulatory Visit: Payer: Self-pay

## 2018-09-19 ENCOUNTER — Ambulatory Visit (HOSPITAL_BASED_OUTPATIENT_CLINIC_OR_DEPARTMENT_OTHER)
Admission: RE | Admit: 2018-09-19 | Discharge: 2018-09-19 | Disposition: A | Discharge status: Home / Self Care | Payer: BC Managed Care – PPO | Source: Ambulatory Visit | Attending: Medical | Admitting: Medical

## 2018-09-19 ENCOUNTER — Encounter: Payer: Self-pay | Admitting: Medical

## 2018-09-19 ENCOUNTER — Ambulatory Visit: Payer: BC Managed Care – PPO | Admitting: Medical

## 2018-09-19 VITALS — BP 124/82 | HR 59 | Temp 97.6°F | Resp 16 | Ht 73.0 in | Wt 235.8 lb

## 2018-09-19 DIAGNOSIS — F329 Major depressive disorder, single episode, unspecified: Secondary | ICD-10-CM

## 2018-09-19 DIAGNOSIS — F32A Depression, unspecified: Secondary | ICD-10-CM

## 2018-09-19 DIAGNOSIS — N50819 Testicular pain, unspecified: Secondary | ICD-10-CM | POA: Diagnosis not present

## 2018-09-19 DIAGNOSIS — R350 Frequency of micturition: Secondary | ICD-10-CM | POA: Diagnosis not present

## 2018-09-19 LAB — COMPREHENSIVE METABOLIC PANEL
ALT: 14 U/L (ref 0–53)
AST: 14 U/L (ref 0–37)
Albumin: 4.4 g/dL (ref 3.5–5.2)
Alkaline Phosphatase: 49 U/L (ref 39–117)
BUN: 9 mg/dL (ref 6–23)
CO2: 30 mEq/L (ref 19–32)
Calcium: 9.4 mg/dL (ref 8.4–10.5)
Chloride: 103 mEq/L (ref 96–112)
Creatinine, Ser: 1.02 mg/dL (ref 0.40–1.50)
GFR: 79.27 mL/min (ref >60.00)
Glucose, Bld: 95 mg/dL (ref 70–99)
Potassium: 4.4 mEq/L (ref 3.5–5.1)
Sodium: 138 mEq/L (ref 135–145)
Total Bilirubin: 0.4 mg/dL (ref 0.2–1.2)
Total Protein: 6.5 g/dL (ref 6.0–8.3)

## 2018-09-19 LAB — POC URINALSYSI DIPSTICK (AUTOMATED)
Bilirubin, UA: NEGATIVE
Blood, UA: NEGATIVE
Glucose, UA: NEGATIVE
Ketones, UA: NEGATIVE
Leukocytes, UA: NEGATIVE
Nitrite, UA: NEGATIVE
Protein, UA: NEGATIVE
Spec Grav, UA: 1.015 (ref 1.010–1.025)
Urobilinogen, UA: 0.2 E.U./dL
pH, UA: 7.5 (ref 5.0–8.0)

## 2018-09-19 LAB — PSA: PSA: 0.48 ng/mL (ref 0.10–4.00)

## 2018-09-19 NOTE — Progress Notes (Signed)
Subjective:    Patient ID: Corey Howe, male    DOB: 10-Sep-1975, 43 y.o.   MRN: [id number]  HPI  I have reviewed pt PMH, PSH, FH, Social History and Surgical History  Mild seasonal allergies in the fall. Does not have to take meds.  History of both depression and anxiety in the past. Was on some med 5-6 years ago. Was on xanax and ssri in past. Did not like side effects. But good mood now.  Pt works at [employer], no exercise, 2-3 cups coffee a day, he admits to eating fried foods, unhealthy, 2 beers a day up until recently, married- wife. Pt has 81 year old boy.  Pt has gained weight over pat 4 years since I last saw him. He has tried to loose weight sporadically but has not stuck to diet.   Pt has some concern about prostate health and testicle health. He urinates more frequently(he urinates once at night and then urge to middle of night.. He has vague possible discomfort to testicles. Also he has concern that his testicles hang to low. He states slight twinge pain to testicle. He thinks more left side. No known history of undescended testicle. He states he will double check with parents.  He does drink 2-6 beers a day. He states if does not drink seems to get depressed. He did not like the way he felt with ssri. Including wellbutrin.     Review of Systems  Constitutional: Negative for chills, fatigue and fever.  HENT: Negative for congestion and drooling.   Respiratory: Negative for cough, chest tightness and shortness of breath.   Cardiovascular: Negative for chest pain and palpitations.  Gastrointestinal: Negative for abdominal pain, diarrhea, nausea and vomiting.  Genitourinary: Positive for frequency and testicular pain. Negative for dysuria, flank pain and urgency.       See hpi.  Musculoskeletal: Negative for back pain, myalgias and neck stiffness.  Skin: Negative for rash.  Neurological: Negative for dizziness, speech difficulty,  weakness and headaches.  Hematological: Negative for adenopathy. Does not bruise/bleed easily.  Psychiatric/Behavioral: Positive for dysphoric mood. Negative for behavioral problems, confusion, sleep disturbance and suicidal ideas. The patient is not nervous/anxious.        See hpi.     Past Medical History:  Diagnosis Date  . Allergy   . Anxiety   . Depression      Social History   Socioeconomic History  . Marital status: Married    Spouse name: Not on file  . Number of children: Not on file  . Years of education: Not on file  . Highest education level: Not on file  Occupational History  . Not on file  Social Needs  . Financial resource strain: Not on file  . Food insecurity    Worry: Not on file    Inability: Not on file  . Transportation needs    Medical: Not on file    Non-medical: Not on file  Tobacco Use  . Smoking status: Never Smoker  . Smokeless tobacco: Never Used  Substance and Sexual Activity  . Alcohol use: Yes    Alcohol/week: 2.0 standard drinks    Types: 2 Cans of beer per week    Comment: 2 beers a day but reduced recently.  . Drug use: No  . Sexual activity: Not on file  Lifestyle  . Physical activity    Days per week: Not on file    Minutes per session:  Not on file  . Stress: Not on file  Relationships  . Social Musicianconnections    Talks on phone: Not on file    Gets together: Not on file    Attends religious service: Not on file    Active member of club or organization: Not on file    Attends meetings of clubs or organizations: Not on file    Relationship status: Not on file  . Intimate partner violence    Fear of current or ex partner: Not on file    Emotionally abused: Not on file    Physically abused: Not on file    Forced sexual activity: Not on file  Other Topics Concern  . Not on file  Social History Narrative  . Not on file    No past surgical history on file.  Family History  Problem Relation Age of Onset  . Gallbladder  disease Mother     No Known Allergies  No current outpatient medications on file prior to visit.   No current facility-administered medications on file prior to visit.     BP 124/82   Pulse (!) 59   Temp 97.6 F (36.4 C) (Oral)   Resp 16   Ht 6\' 1"  (1.854 m)   Wt 235 lb 12.8 oz (107 kg)   SpO2 100%   BMI 31.11 kg/m       Objective:   Physical Exam  General Mental Status- Alert. General Appearance- Not in acute distress.   Skin General: Color- Normal Color. Moisture- Normal Moisture.  Neck Carotid Arteries- Normal color. Moisture- Normal Moisture. No carotid bruits. No JVD.  Chest and Lung Exam Auscultation: Breath Sounds:-Normal.  Cardiovascular Auscultation:Rythm- Regular. Murmurs & Other Heart Sounds:Auscultation of the heart reveals- No Murmurs.  Abdomen Inspection:-Inspeection Normal. Palpation/Percussion:Note:No mass. Palpation and Percussion of the abdomen reveal- Non Tender, Non Distended + BS, no rebound or guarding.   Neurologic Cranial Nerve exam:- CN III-XII intact(No nystagmus), symmetric smile. Strength:- 5/5 equal and symmetric strength both upper and lower extremities.  Genital exam- no obvious tenderness on exam. Inguinal clear with no hernia.      Assessment & Plan:  For your recent frequent urination and concern about prostate health, I did place order for urinalysis, urine culture and PSA.  For your history of mild intermittent testicle pain, I did place order for scrotal ultrasound.  For desired weight loss, would recommend that you cut back on your alcohol use and eat a healthier diet as we discussed.  For history of depression, would be also recommend cutting back on alcohol to at most 1-2 beers a day.  Also want you to consider a low dose Effexor 37.5mg  for depression.  I have some concern that you might be self treating mood disorder with alcohol use.  Follow-up in 10 to 14 days for CPE.  At that time would do DRE.  Esperanza RichtersEdward  Nesta Scaturro, PA-C

## 2018-09-19 NOTE — Telephone Encounter (Signed)
Opened to review 

## 2018-09-19 NOTE — Telephone Encounter (Signed)
Prostate protein within the low normal range.  Kidney function and liver enzymes were also normal.  The urine looked clear but the urine culture still pending.  Please notify patient.  But also have him keep the CPE/wellness exam done since I do want him to get infection fighting cell count and lipid panel fasting on that day.

## 2018-09-19 NOTE — Patient Instructions (Addendum)
For your recent frequent urination and concern about prostate health, I did place order for urinalysis, urine culture and PSA.  For your history of mild intermittent testicle pain, I did place order for scrotal ultrasound.  For desired weight loss, would recommend that you cut back on your alcohol use and eat a healthier diet as we discussed.  For history of depression, would be also recommend cutting back on alcohol to at most 1-2 beers a day.  Also want you to consider a low dose Effexor 37.5mg  for depression.  I have some concern that you might be self treating mood disorder with alcohol use.  Follow-up in 10 to 14 days for CPE.  At that time would do DRE.

## 2018-09-20 NOTE — Telephone Encounter (Signed)
Pt notified of results

## 2018-09-21 LAB — URINE CULTURE
MICRO NUMBER:: 758884
SPECIMEN QUALITY:: ADEQUATE

## 2018-09-29 ENCOUNTER — Encounter: Payer: Self-pay | Admitting: Medical

## 2018-09-29 ENCOUNTER — Other Ambulatory Visit: Payer: Self-pay

## 2018-09-29 ENCOUNTER — Ambulatory Visit (INDEPENDENT_AMBULATORY_CARE_PROVIDER_SITE_OTHER): Payer: BC Managed Care – PPO | Admitting: Medical

## 2018-09-29 VITALS — BP 114/76 | HR 59 | Temp 97.4°F | Resp 16 | Ht 73.0 in | Wt 231.2 lb

## 2018-09-29 DIAGNOSIS — Z0000 Encounter for general adult medical examination without abnormal findings: Secondary | ICD-10-CM

## 2018-09-29 LAB — COMPREHENSIVE METABOLIC PANEL
ALT: 17 U/L (ref 0–53)
AST: 16 U/L (ref 0–37)
Albumin: 4.9 g/dL (ref 3.5–5.2)
Alkaline Phosphatase: 53 U/L (ref 39–117)
BUN: 11 mg/dL (ref 6–23)
CO2: 30 mEq/L (ref 19–32)
Calcium: 9.4 mg/dL (ref 8.4–10.5)
Chloride: 101 mEq/L (ref 96–112)
Creatinine, Ser: 1.02 mg/dL (ref 0.40–1.50)
GFR: 79.26 mL/min (ref >60.00)
Glucose, Bld: 93 mg/dL (ref 70–99)
Potassium: 4.3 mEq/L (ref 3.5–5.1)
Sodium: 137 mEq/L (ref 135–145)
Total Bilirubin: 0.8 mg/dL (ref 0.2–1.2)
Total Protein: 7 g/dL (ref 6.0–8.3)

## 2018-09-29 LAB — CBC WITH DIFFERENTIAL/PLATELET
Basophils Absolute: 0 10*3/uL (ref 0.0–0.1)
Basophils Relative: 0.6 % (ref 0.0–3.0)
Eosinophils Absolute: 0.1 10*3/uL (ref 0.0–0.7)
Eosinophils Relative: 2.1 % (ref 0.0–5.0)
HCT: 45 % (ref 39.0–52.0)
Hemoglobin: 15.2 g/dL (ref 13.0–17.0)
Lymphocytes Relative: 36.7 % (ref 12.0–46.0)
Lymphs Abs: 1.7 10*3/uL (ref 0.7–4.0)
MCHC: 33.8 g/dL (ref 30.0–36.0)
MCV: 92.7 fl (ref 78.0–100.0)
Monocytes Absolute: 0.5 10*3/uL (ref 0.1–1.0)
Monocytes Relative: 10.3 % (ref 3.0–12.0)
Neutro Abs: 2.4 10*3/uL (ref 1.4–7.7)
Neutrophils Relative %: 50.3 % (ref 43.0–77.0)
Platelets: 178 10*3/uL (ref 150.0–400.0)
RBC: 4.85 Mil/uL (ref 4.22–5.81)
RDW: 12.7 % (ref 11.5–15.5)
WBC: 4.7 10*3/uL (ref 4.0–10.5)

## 2018-09-29 LAB — LIPID PANEL
Cholesterol: 197 mg/dL (ref 0–200)
HDL: 40.5 mg/dL (ref >39.00)
LDL Cholesterol: 141 mg/dL — ABNORMAL HIGH (ref 0–99)
NonHDL: 156.29
Total CHOL/HDL Ratio: 5
Triglycerides: 78 mg/dL (ref 0.0–149.0)
VLDL: 15.6 mg/dL (ref 0.0–40.0)

## 2018-09-29 NOTE — Progress Notes (Signed)
Subjective:    Patient ID: Corey Howe, male    DOB: 07-Mar-1975, 43 y.o.   MRN: [id number]  HPI    Pt works at [employer], no exercise, 2-3 cups coffee a day, he admits to eating fried foods, unhealthy, 2 beers a day up until recently, married- wife. Pt has 37 year old boy. Pt nonsmoker.   Up to date on vaccines except flu.  Pt did not eat breakfast today.  Pt states has lost some weight by eating better. But not exercising. He has cut back on alcohol.    Review of Systems  Constitutional: Negative for chills, fatigue and fever.  HENT: Positive for congestion. Negative for ear discharge, rhinorrhea, sinus pressure and sinus pain.        Pt states chronic nasal congestion for years. He states related to possibl nose fx. He thinks has deviated septum  Respiratory: Negative for cough, chest tightness and wheezing.   Cardiovascular: Negative for chest pain and palpitations.  Gastrointestinal: Negative for abdominal pain.  Genitourinary: Negative for dysuria, frequency, scrotal swelling and urgency.  Musculoskeletal: Negative for arthralgias and myalgias.  Skin: Negative for rash.  Neurological: Negative for dizziness.  Psychiatric/Behavioral: Negative for behavioral problems, sleep disturbance and suicidal ideas.       His anxiety is less. He does not want to consider effexor. Notes aversion to medication use.    Past Medical History:  Diagnosis Date  . Allergy   . Anxiety   . Depression      Social History   Socioeconomic History  . Marital status: Married    Spouse name: Not on file  . Number of children: Not on file  . Years of education: Not on file  . Highest education level: Not on file  Occupational History  . Occupation: clerk   Social Needs  . Financial resource strain: Not on file  . Food insecurity    Worry: Not on file    Inability: Not on file  . Transportation needs    Medical: Not on file    Non-medical: Not  on file  Tobacco Use  . Smoking status: Never Smoker  . Smokeless tobacco: Never Used  Substance and Sexual Activity  . Alcohol use: Yes    Alcohol/week: 2.0 standard drinks    Types: 2 Cans of beer per week    Comment: 2-6 beers a day but reduced recently.  . Drug use: No  . Sexual activity: Yes  Lifestyle  . Physical activity    Days per week: Not on file    Minutes per session: Not on file  . Stress: Not on file  Relationships  . Social Herbalist on phone: Not on file    Gets together: Not on file    Attends religious service: Not on file    Active member of club or organization: Not on file    Attends meetings of clubs or organizations: Not on file    Relationship status: Not on file  . Intimate partner violence    Fear of current or ex partner: Not on file    Emotionally abused: Not on file    Physically abused: Not on file    Forced sexual activity: Not on file  Other Topics Concern  . Not on file  Social History Narrative  . Not on file    Past Surgical History:  Procedure Laterality Date  . HERNIA REPAIR  umbilical hernia.    Family History  Problem Relation Age of Onset  . Gallbladder disease Mother     No Known Allergies  No current outpatient medications on file prior to visit.   No current facility-administered medications on file prior to visit.     BP 114/76   Pulse (!) 59   Temp (!) 97.4 F (36.3 C) (Temporal)   Resp 16   Ht 6\' 1"  (1.854 m)   Wt 231 lb 3.2 oz (104.9 kg)   SpO2 100%   BMI 30.50 kg/m       Objective:   Physical Exam  General Mental Status- Alert. General Appearance- Not in acute distress.   Skin General: Color- Normal Color. Moisture- Normal Moisture. No worrisome moles or lesions.  Neck Carotid Arteries- Normal color. Moisture- Normal Moisture. No carotid bruits. No JVD.  Chest and Lung Exam Auscultation: Breath Sounds:-Normal.  Cardiovascular Auscultation:Rythm- Regular. Murmurs & Other  Heart Sounds:Auscultation of the heart reveals- No Murmurs.  Abdomen Inspection:-Inspeection Normal. Palpation/Percussion:Note:No mass. Palpation and Percussion of the abdomen reveal- Non Tender, Non Distended + BS, no rebound or guarding.   Neurologic Cranial Nerve exam:- CN III-XII intact(No nystagmus), symmetric smile. Strength:- 5/5 equal and symmetric strength both upper and lower extremities.  Rectal- pt declined exam after discussion/counseling.     Assessment & Plan:  For you wellness exam today I have ordered cbc, cmp and lipid panel  Educated on benefits of flu vaccine. Pt declined vaccine.   Recommend exercise and healthy diet.  We will let you know lab results as they come in.  Follow up date appointment will be determined after lab review.   Esperanza RichtersEdward Cassanda Walmer, PA-C

## 2018-09-29 NOTE — Patient Instructions (Addendum)
For you wellness exam today I have ordered cbc, cmp and lipid panel  Educated on benefits of flu vaccine. Pt declined vaccine.   Recommend exercise and healthy diet.  We will let you know lab results as they come in.  Follow up date appointment will be determined after lab review.     Preventive Care 20-44 Years Old, Male Preventive care refers to lifestyle choices and visits with your health care provider that can promote health and wellness. This includes:  A yearly physical exam. This is also called an annual well check.  Regular dental and eye exams.  Immunizations.  Screening for certain conditions.  Healthy lifestyle choices, such as eating a healthy diet, getting regular exercise, not using drugs or products that contain nicotine and tobacco, and limiting alcohol use. What can I expect for my preventive care visit? Physical exam Your health care provider will check:  Height and weight. These may be used to calculate body mass index (BMI), which is a measurement that tells if you are at a healthy weight.  Heart rate and blood pressure.  Your skin for abnormal spots. Counseling Your health care provider may ask you questions about:  Alcohol, tobacco, and drug use.  Emotional well-being.  Home and relationship well-being.  Sexual activity.  Eating habits.  Work and work Statistician. What immunizations do I need?  Influenza (flu) vaccine  This is recommended every year. Tetanus, diphtheria, and pertussis (Tdap) vaccine  You may need a Td booster every 10 years. Varicella (chickenpox) vaccine  You may need this vaccine if you have not already been vaccinated. Zoster (shingles) vaccine  You may need this after age 9. Measles, mumps, and rubella (MMR) vaccine  You may need at least one dose of MMR if you were born in 1957 or later. You may also need a second dose. Pneumococcal conjugate (PCV13) vaccine  You may need this if you have certain  conditions and were not previously vaccinated. Pneumococcal polysaccharide (PPSV23) vaccine  You may need one or two doses if you smoke cigarettes or if you have certain conditions. Meningococcal conjugate (MenACWY) vaccine  You may need this if you have certain conditions. Hepatitis A vaccine  You may need this if you have certain conditions or if you travel or work in places where you may be exposed to hepatitis A. Hepatitis B vaccine  You may need this if you have certain conditions or if you travel or work in places where you may be exposed to hepatitis B. Haemophilus influenzae type b (Hib) vaccine  You may need this if you have certain risk factors. Human papillomavirus (HPV) vaccine  If recommended by your health care provider, you may need three doses over 6 months. You may receive vaccines as individual doses or as more than one vaccine together in one shot (combination vaccines). Talk with your health care provider about the risks and benefits of combination vaccines. What tests do I need? Blood tests  Lipid and cholesterol levels. These may be checked every 5 years, or more frequently if you are over 40 years old.  Hepatitis C test.  Hepatitis B test. Screening  Lung cancer screening. You may have this screening every year starting at age 64 if you have a 30-pack-year history of smoking and currently smoke or have quit within the past 15 years.  Prostate cancer screening. Recommendations will vary depending on your family history and other risks.  Colorectal cancer screening. All adults should have this screening starting at  age 60 and continuing until age 80. Your health care provider may recommend screening at age 25 if you are at increased risk. You will have tests every 1-10 years, depending on your results and the type of screening test.  Diabetes screening. This is done by checking your blood sugar (glucose) after you have not eaten for a while (fasting). You may  have this done every 1-3 years.  Sexually transmitted disease (STD) testing. Follow these instructions at home: Eating and drinking  Eat a diet that includes fresh fruits and vegetables, whole grains, lean protein, and low-fat dairy products.  Take vitamin and mineral supplements as recommended by your health care provider.  Do not drink alcohol if your health care provider tells you not to drink.  If you drink alcohol: ? Limit how much you have to 0-2 drinks a day. ? Be aware of how much alcohol is in your drink. In the [place] one drink equals one 12 oz bottle of beer (355 mL), one 5 oz glass of wine (148 mL), or one 1 oz glass of hard liquor (44 mL). Lifestyle  Take daily care of your teeth and gums.  Stay active. Exercise for at least 30 minutes on 5 or more days each week.  Do not use any products that contain nicotine or tobacco, such as cigarettes, e-cigarettes, and chewing tobacco. If you need help quitting, ask your health care provider.  If you are sexually active, practice safe sex. Use a condom or other form of protection to prevent STIs (sexually transmitted infections).  Talk with your health care provider about taking a low-dose aspirin every day starting at age 38. What's next?  Go to your health care provider once a year for a well check visit.  Ask your health care provider how often you should have your eyes and teeth checked.  Stay up to date on all vaccines. This information is not intended to replace advice given to you by your health care provider. Make sure you discuss any questions you have with your health care provider. Document Released: 02/21/2015 Document Revised: 01/19/2018 Document Reviewed: 01/19/2018 [employer] Patient Education  2020 [employer].
